# Patient Record
Sex: Female | Born: 2000 | Race: Black or African American | Hispanic: No | Marital: Single | Smoking: Never smoker
Health system: Southern US, Community
[De-identification: ages and names within clinical notes are randomized; demographics above are authoritative.]

---
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
## 2020-04-25 DIAGNOSIS — Z3202 Encounter for pregnancy test, result negative: Secondary | ICD-10-CM | POA: Diagnosis not present

## 2020-04-25 DIAGNOSIS — N390 Urinary tract infection, site not specified: Secondary | ICD-10-CM | POA: Diagnosis not present

## 2020-04-25 DIAGNOSIS — Z309 Encounter for contraceptive management, unspecified: Secondary | ICD-10-CM | POA: Diagnosis not present

## 2020-04-25 DIAGNOSIS — Z113 Encounter for screening for infections with a predominantly sexual mode of transmission: Secondary | ICD-10-CM | POA: Diagnosis not present

## 2020-04-25 DIAGNOSIS — Z6831 Body mass index (BMI) 31.0-31.9, adult: Secondary | ICD-10-CM | POA: Diagnosis not present

## 2020-04-25 DIAGNOSIS — Z01419 Encounter for gynecological examination (general) (routine) without abnormal findings: Secondary | ICD-10-CM | POA: Diagnosis not present

## 2020-04-25 DIAGNOSIS — R8290 Unspecified abnormal findings in urine: Secondary | ICD-10-CM | POA: Diagnosis not present

## 2020-05-16 DIAGNOSIS — Z30013 Encounter for initial prescription of injectable contraceptive: Secondary | ICD-10-CM | POA: Diagnosis not present

## 2020-07-26 DIAGNOSIS — R350 Frequency of micturition: Secondary | ICD-10-CM | POA: Diagnosis not present

## 2020-07-26 DIAGNOSIS — R8290 Unspecified abnormal findings in urine: Secondary | ICD-10-CM | POA: Diagnosis not present

## 2020-07-26 DIAGNOSIS — Z309 Encounter for contraceptive management, unspecified: Secondary | ICD-10-CM | POA: Diagnosis not present

## 2020-12-12 DIAGNOSIS — R8290 Unspecified abnormal findings in urine: Secondary | ICD-10-CM | POA: Diagnosis not present

## 2020-12-21 ENCOUNTER — Ambulatory Visit
Admission: EM | Admit: 2020-12-21 | Discharge: 2020-12-21 | Disposition: A | Discharge status: Home / Self Care | Payer: 59 | Attending: Internal Medicine | Admitting: Internal Medicine

## 2020-12-21 ENCOUNTER — Encounter: Payer: Self-pay | Admitting: Emergency Medicine

## 2020-12-21 ENCOUNTER — Other Ambulatory Visit: Payer: Self-pay

## 2020-12-21 DIAGNOSIS — R112 Nausea with vomiting, unspecified: Secondary | ICD-10-CM | POA: Diagnosis not present

## 2020-12-21 DIAGNOSIS — R197 Diarrhea, unspecified: Secondary | ICD-10-CM

## 2020-12-21 DIAGNOSIS — R509 Fever, unspecified: Secondary | ICD-10-CM

## 2020-12-21 DIAGNOSIS — Z20822 Contact with and (suspected) exposure to covid-19: Secondary | ICD-10-CM

## 2020-12-21 LAB — POCT URINE PREGNANCY: Preg Test, Ur: NEGATIVE

## 2020-12-21 LAB — POCT URINALYSIS DIP (MANUAL ENTRY)
Bilirubin, UA: NEGATIVE
Blood, UA: NEGATIVE
Glucose, UA: NEGATIVE mg/dL
Ketones, POC UA: NEGATIVE mg/dL
Leukocytes, UA: NEGATIVE
Nitrite, UA: NEGATIVE
Protein Ur, POC: 30 mg/dL — AB
Spec Grav, UA: >=1.03 — AB (ref 1.010–1.025)
Urobilinogen, UA: 1 E.U./dL
pH, UA: 6.5 (ref 5.0–8.0)

## 2020-12-21 MED ORDER — ONDANSETRON 4 MG PO TBDP: 4.0000 mg | ORAL_TABLET | Freq: Three times a day (TID) | ORAL | 0 refills | Status: AC | PRN

## 2020-12-21 NOTE — Discharge Instructions (Signed)
Your COVID-19 and flu swab is pending.  We will call if it is positive.  You have been prescribed nausea medication to take as needed.  Please increase clear oral fluid intake.  Continue to monitor fevers as well.  Go to the hospital if symptoms worsen.

## 2020-12-21 NOTE — ED Provider Notes (Signed)
[facility]    CSN: [id number] Arrival date & time: 12/21/20  1652      History   Chief Complaint Chief Complaint  Patient presents with   Back Pain    HPI Sophia Munoz is a 20 y.o. female.   Patient presents with low back pain, fever, body aches, chills, nausea, vomiting that started approximately 3 days ago.  T-max at home was 102 yesterday.  Patient has been able to keep fluids down but not regular foods.  No blood in stool or emesis.  Denies abdominal pain.  Patient reports that she is currently on antibiotics for urinary tract infection that is being treated by her gynecologist that has been present for approximately 1 week.  Although, patient denies urinary burning, urinary frequency, pelvic pain, abdominal pain, fever, vaginal discharge, hematuria.  Patient reports that she did not have any of the symptoms at the start of her urinary tract infection symptoms either.  Patient has taken Tylenol with minimal improvement in symptoms.  Denies any upper respiratory symptoms, chest pain, shortness of breath.  Back pain is bilateral.  Denies any injuries to the back.  Back pain is intermittent.  Pain is not exacerbated by movement.   Back Pain  History reviewed. No pertinent past medical history.  There are no problems to display for this patient.   History reviewed. No pertinent surgical history.  OB History   No obstetric history on file.      Home Medications    Prior to Admission medications   Medication Sig Start Date End Date Taking? Authorizing Provider  ondansetron (ZOFRAN-ODT) 4 MG disintegrating tablet Take 1 tablet (4 mg total) by mouth every 8 (eight) hours as needed for nausea or vomiting. 12/21/20  Yes Cillian Gwinner, Acie Fredrickson, FNP  valACYclovir (VALTREX) 1000 MG tablet Take 1 tablet (1,000 mg total) by mouth 3 (three) times daily. 05/30/16  Yes Phelps, Erin O, PA-C  ibuprofen (ADVIL,MOTRIN) 800 MG tablet Take 1 tablet (800 mg total) by mouth 3 (three) times  daily. 08/02/17   Georgetta Haber, NP  liver oil-zinc oxide (DESITIN) 40 % ointment Apply 1 application topically as needed for irritation. 05/30/16   Lurene Shadow, PA-C  phenol (CHLORASEPTIC) 1.4 % LIQD Use as directed 1 spray in the mouth or throat as needed for throat irritation / pain. 06/08/17   Georgiana Shore, PA-C    Family History No family history on file.  Social History Social History   Tobacco Use   Smoking status: Never   Smokeless tobacco: Never  Substance Use Topics   Alcohol use: Yes   Drug use: Yes    Types: Marijuana     Allergies   Patient has no known allergies.   Review of Systems Review of Systems Per HPI  Physical Exam Triage Vital Signs ED Triage Vitals  Enc Vitals Group     BP 12/21/20 1736 103/67     Pulse Rate 12/21/20 1736 93     Resp 12/21/20 1736 18     Temp 12/21/20 1736 98.1 F (36.7 C)     Temp Source 12/21/20 1736 Oral     SpO2 12/21/20 1736 98 %     Weight 12/21/20 1738 185 lb (83.9 kg)     Height 12/21/20 1738 5\' 5"  (1.651 m)     Head Circumference --      Peak Flow --      Pain Score 12/21/20 1738 4     Pain Loc --  Pain Edu? --      Excl. in GC? --    No data found.  Updated Vital Signs BP 103/67 (BP Location: Left Arm)   Pulse 93   Temp 98.1 F (36.7 C) (Oral)   Resp 18   Ht 5\' 5"  (1.651 m)   Wt 185 lb (83.9 kg)   SpO2 98%   BMI 30.79 kg/m   Visual Acuity Right Eye Distance:   Left Eye Distance:   Bilateral Distance:    Right Eye Near:   Left Eye Near:    Bilateral Near:     Physical Exam Constitutional:      General: She is not in acute distress.    Appearance: Normal appearance. She is not toxic-appearing or diaphoretic.  HENT:     Head: Normocephalic and atraumatic.     Mouth/Throat:     Mouth: Mucous membranes are moist.     Pharynx: No posterior oropharyngeal erythema.  Eyes:     Extraocular Movements: Extraocular movements intact.     Conjunctiva/sclera: Conjunctivae normal.   Cardiovascular:     Rate and Rhythm: Normal rate and regular rhythm.     Pulses: Normal pulses.     Heart sounds: Normal heart sounds.  Pulmonary:     Effort: Pulmonary effort is normal. No respiratory distress.     Breath sounds: Normal breath sounds.  Abdominal:     General: Abdomen is flat. Bowel sounds are normal. There is no distension.     Palpations: Abdomen is soft.     Tenderness: There is no abdominal tenderness.  Musculoskeletal:     Cervical back: Normal.     Thoracic back: Normal.     Lumbar back: No swelling, edema, tenderness or bony tenderness. Normal range of motion. Negative right straight leg raise test and negative left straight leg raise test.     Comments: No tenderness to palpation.  Patient reports that pain is just intermittent.  Skin:    General: Skin is warm and dry.  Neurological:     General: No focal deficit present.     Mental Status: She is alert and oriented to person, place, and time. Mental status is at baseline.     Deep Tendon Reflexes: Reflexes are normal and symmetric.  Psychiatric:        Mood and Affect: Mood normal.        Behavior: Behavior normal.        Thought Content: Thought content normal.        Judgment: Judgment normal.     UC Treatments / Results  Labs (all labs ordered are listed, but only abnormal results are displayed) Labs Reviewed  POCT URINALYSIS DIP (MANUAL ENTRY) - Abnormal; Notable for the following components:      Result Value   Spec Grav, UA >=1.030 (*)    Protein Ur, POC =30 (*)    All other components within normal limits  COVID-19, FLU A+B NAA  POCT URINE PREGNANCY    EKG   Radiology No results found.  Procedures Procedures (including critical care time)  Medications Ordered in UC Medications - No data to display  Initial Impression / Assessment and Plan / UC Course  I have reviewed the triage vital signs and the nursing notes.  Pertinent labs & imaging results that were available during my  care of the patient were reviewed by me and considered in my medical decision making (see chart for details).     Urinalysis was completed to  rule out complications of urinary tract infection.  Urinalysis was not showing signs of urinary tract infection anymore.  Suspect that patient has symptoms of a viral etiology on top of urinary tract infection.  COVID-19 and flu swab pending.  Symptoms should resolve in the next few days with symptomatic treatment.  Ondansetron prescribed as needed for nausea.  Patient to increase clear oral fluid intake.  Fever monitoring management discussed with patient.  Advised patient to go to the hospital if symptoms worsen or do not improve.Discussed strict return precautions. Patient verbalized understanding and is agreeable with plan.  Final Clinical Impressions(s) / UC Diagnoses   Final diagnoses:  Nausea vomiting and diarrhea  Fever, unspecified  Encounter for laboratory testing for COVID-19 virus     Discharge Instructions      Your COVID-19 and flu swab is pending.  We will call if it is positive.  You have been prescribed nausea medication to take as needed.  Please increase clear oral fluid intake.  Continue to monitor fevers as well.  Go to the hospital if symptoms worsen.     ED Prescriptions     Medication Sig Dispense Auth. Provider   ondansetron (ZOFRAN-ODT) 4 MG disintegrating tablet Take 1 tablet (4 mg total) by mouth every 8 (eight) hours as needed for nausea or vomiting. 20 tablet [place], Acie Fredrickson, [place]      PDMP not reviewed this encounter.   Gustavus Bryant, [place] 12/21/20 [phone]

## 2020-12-21 NOTE — ED Triage Notes (Signed)
Patient c/o low back pain x 3 days, febrile yesterday, body aches and chills.  No u/a sx's.  No injury.  Patient has taken Tylenol.

## 2020-12-22 LAB — COVID-19, FLU A+B NAA
Influenza A, NAA: NOT DETECTED
Influenza B, NAA: NOT DETECTED
SARS-CoV-2, NAA: NOT DETECTED

## 2021-02-09 DIAGNOSIS — Z3202 Encounter for pregnancy test, result negative: Secondary | ICD-10-CM | POA: Diagnosis not present

## 2021-02-09 DIAGNOSIS — Z3042 Encounter for surveillance of injectable contraceptive: Secondary | ICD-10-CM | POA: Diagnosis not present

## 2021-05-08 DIAGNOSIS — Z3042 Encounter for surveillance of injectable contraceptive: Secondary | ICD-10-CM | POA: Diagnosis not present

## 2021-06-29 DIAGNOSIS — Z309 Encounter for contraceptive management, unspecified: Secondary | ICD-10-CM | POA: Diagnosis not present

## 2021-06-29 DIAGNOSIS — N930 Postcoital and contact bleeding: Secondary | ICD-10-CM | POA: Diagnosis not present

## 2021-06-29 DIAGNOSIS — Z01419 Encounter for gynecological examination (general) (routine) without abnormal findings: Secondary | ICD-10-CM | POA: Diagnosis not present

## 2021-06-29 DIAGNOSIS — Z6831 Body mass index (BMI) 31.0-31.9, adult: Secondary | ICD-10-CM | POA: Diagnosis not present

## 2021-06-29 DIAGNOSIS — N760 Acute vaginitis: Secondary | ICD-10-CM | POA: Diagnosis not present

## 2021-06-29 DIAGNOSIS — Z113 Encounter for screening for infections with a predominantly sexual mode of transmission: Secondary | ICD-10-CM | POA: Diagnosis not present

## 2021-08-01 DIAGNOSIS — A599 Trichomoniasis, unspecified: Secondary | ICD-10-CM | POA: Diagnosis not present

## 2021-08-01 DIAGNOSIS — Z87440 Personal history of urinary (tract) infections: Secondary | ICD-10-CM | POA: Diagnosis not present

## 2021-08-01 DIAGNOSIS — Z3202 Encounter for pregnancy test, result negative: Secondary | ICD-10-CM | POA: Diagnosis not present

## 2021-08-01 DIAGNOSIS — N760 Acute vaginitis: Secondary | ICD-10-CM | POA: Diagnosis not present

## 2021-08-01 DIAGNOSIS — Z3042 Encounter for surveillance of injectable contraceptive: Secondary | ICD-10-CM | POA: Diagnosis not present

## 2021-08-01 DIAGNOSIS — Z309 Encounter for contraceptive management, unspecified: Secondary | ICD-10-CM | POA: Diagnosis not present

## 2021-10-19 DIAGNOSIS — Z3042 Encounter for surveillance of injectable contraceptive: Secondary | ICD-10-CM | POA: Diagnosis not present

## 2022-01-16 DIAGNOSIS — Z3042 Encounter for surveillance of injectable contraceptive: Secondary | ICD-10-CM | POA: Diagnosis not present

## 2022-04-12 ENCOUNTER — Other Ambulatory Visit (HOSPITAL_COMMUNITY): Payer: Self-pay

## 2022-04-12 MED ORDER — MEDROXYPROGESTERONE ACETATE 150 MG/ML IM SUSP
150.0000 mg | INTRAMUSCULAR | 0 refills | Status: DC
  Filled 2022-04-12: qty 1, 90d supply, fill #0

## 2022-04-17 DIAGNOSIS — Z3042 Encounter for surveillance of injectable contraceptive: Secondary | ICD-10-CM | POA: Diagnosis not present

## 2022-05-07 ENCOUNTER — Other Ambulatory Visit (HOSPITAL_COMMUNITY): Payer: Self-pay

## 2022-07-09 ENCOUNTER — Other Ambulatory Visit (HOSPITAL_COMMUNITY): Payer: Self-pay

## 2022-07-10 ENCOUNTER — Other Ambulatory Visit (HOSPITAL_COMMUNITY): Payer: Self-pay

## 2022-07-10 MED ORDER — MEDROXYPROGESTERONE ACETATE 150 MG/ML IM SUSY
150.0000 mg | PREFILLED_SYRINGE | INTRAMUSCULAR | 0 refills | Status: DC
  Filled 2022-07-10: qty 1, 90d supply, fill #0

## 2022-07-16 DIAGNOSIS — Z3042 Encounter for surveillance of injectable contraceptive: Secondary | ICD-10-CM | POA: Diagnosis not present

## 2022-07-31 ENCOUNTER — Other Ambulatory Visit (HOSPITAL_COMMUNITY): Payer: Self-pay

## 2022-07-31 DIAGNOSIS — Z309 Encounter for contraceptive management, unspecified: Secondary | ICD-10-CM | POA: Diagnosis not present

## 2022-07-31 DIAGNOSIS — Z6832 Body mass index (BMI) 32.0-32.9, adult: Secondary | ICD-10-CM | POA: Diagnosis not present

## 2022-07-31 DIAGNOSIS — N760 Acute vaginitis: Secondary | ICD-10-CM | POA: Diagnosis not present

## 2022-07-31 DIAGNOSIS — Z01419 Encounter for gynecological examination (general) (routine) without abnormal findings: Secondary | ICD-10-CM | POA: Diagnosis not present

## 2022-07-31 DIAGNOSIS — Z113 Encounter for screening for infections with a predominantly sexual mode of transmission: Secondary | ICD-10-CM | POA: Diagnosis not present

## 2022-07-31 MED ORDER — METRONIDAZOLE 500 MG PO TABS
500.0000 mg | ORAL_TABLET | Freq: Two times a day (BID) | ORAL | 0 refills | Status: DC
  Filled 2022-07-31: qty 14, 7d supply, fill #0

## 2022-07-31 MED ORDER — MEDROXYPROGESTERONE ACETATE 150 MG/ML IM SUSP
150.0000 mg | INTRAMUSCULAR | 3 refills | Status: AC
  Filled 2022-07-31: qty 1, 90d supply, fill #0
  Filled 2022-11-26 – 2023-07-02 (×3): qty 1, 90d supply, fill #1

## 2022-08-03 ENCOUNTER — Other Ambulatory Visit (HOSPITAL_COMMUNITY): Payer: Self-pay

## 2022-09-21 ENCOUNTER — Other Ambulatory Visit (HOSPITAL_COMMUNITY): Payer: Self-pay

## 2022-10-15 DIAGNOSIS — Z3042 Encounter for surveillance of injectable contraceptive: Secondary | ICD-10-CM | POA: Diagnosis not present

## 2022-11-26 ENCOUNTER — Other Ambulatory Visit: Payer: Self-pay

## 2022-11-26 ENCOUNTER — Other Ambulatory Visit (HOSPITAL_COMMUNITY): Payer: Self-pay

## 2022-11-26 MED ORDER — MEDROXYPROGESTERONE ACETATE 150 MG/ML IM SUSY
150.0000 mg | PREFILLED_SYRINGE | INTRAMUSCULAR | 2 refills | Status: AC
  Filled 2022-11-26: qty 1, 90d supply, fill #0
  Filled 2023-04-15: qty 1, 90d supply, fill #1

## 2022-12-03 ENCOUNTER — Other Ambulatory Visit (HOSPITAL_COMMUNITY): Payer: Self-pay

## 2023-01-14 DIAGNOSIS — Z3042 Encounter for surveillance of injectable contraceptive: Secondary | ICD-10-CM | POA: Diagnosis not present

## 2023-01-17 ENCOUNTER — Other Ambulatory Visit (HOSPITAL_COMMUNITY): Payer: Self-pay

## 2023-01-17 MED ORDER — CHLORHEXIDINE GLUCONATE 0.12 % MT SOLN
15.0000 mL | Freq: Two times a day (BID) | OROMUCOSAL | 0 refills | Status: AC
  Filled 2023-01-17: qty 473, 16d supply, fill #0

## 2023-01-17 MED ORDER — PENICILLIN V POTASSIUM 500 MG PO TABS
500.0000 mg | ORAL_TABLET | Freq: Four times a day (QID) | ORAL | 0 refills | Status: AC
  Filled 2023-01-17: qty 56, 14d supply, fill #0

## 2023-01-17 MED ORDER — IBUPROFEN 800 MG PO TABS
800.0000 mg | ORAL_TABLET | Freq: Four times a day (QID) | ORAL | 0 refills | Status: AC | PRN
  Filled 2023-01-17: qty 20, 5d supply, fill #0

## 2023-04-11 ENCOUNTER — Other Ambulatory Visit (HOSPITAL_COMMUNITY): Payer: Self-pay

## 2023-04-15 ENCOUNTER — Other Ambulatory Visit (HOSPITAL_COMMUNITY): Payer: Self-pay

## 2023-04-15 DIAGNOSIS — Z3042 Encounter for surveillance of injectable contraceptive: Secondary | ICD-10-CM | POA: Diagnosis not present

## 2023-07-08 ENCOUNTER — Other Ambulatory Visit (HOSPITAL_COMMUNITY): Payer: Self-pay

## 2023-07-15 DIAGNOSIS — Z3042 Encounter for surveillance of injectable contraceptive: Secondary | ICD-10-CM | POA: Diagnosis not present
